# Patient Record
Sex: Male | Born: 1935 | Race: White | Hispanic: No | Marital: Single | Smoking: Never smoker
Health system: Southern US, Community
[De-identification: ages and names within clinical notes are randomized; demographics above are authoritative.]

## PROBLEM LIST (undated history)

## (undated) DIAGNOSIS — R32 Unspecified urinary incontinence: Secondary | ICD-10-CM

## (undated) DIAGNOSIS — K219 Gastro-esophageal reflux disease without esophagitis: Secondary | ICD-10-CM

## (undated) DIAGNOSIS — I10 Essential (primary) hypertension: Secondary | ICD-10-CM

## (undated) DIAGNOSIS — I2510 Atherosclerotic heart disease of native coronary artery without angina pectoris: Secondary | ICD-10-CM

## (undated) DIAGNOSIS — F0151 Vascular dementia with behavioral disturbance: Secondary | ICD-10-CM

## (undated) DIAGNOSIS — H04123 Dry eye syndrome of bilateral lacrimal glands: Secondary | ICD-10-CM

## (undated) DIAGNOSIS — R413 Other amnesia: Secondary | ICD-10-CM

## (undated) DIAGNOSIS — R6889 Other general symptoms and signs: Secondary | ICD-10-CM

## (undated) DIAGNOSIS — Z974 Presence of external hearing-aid: Secondary | ICD-10-CM

## (undated) DIAGNOSIS — Z955 Presence of coronary angioplasty implant and graft: Secondary | ICD-10-CM

## (undated) DIAGNOSIS — D638 Anemia in other chronic diseases classified elsewhere: Secondary | ICD-10-CM

## (undated) DIAGNOSIS — F01A18 Vascular dementia, mild, with other behavioral disturbance: Secondary | ICD-10-CM

## (undated) DIAGNOSIS — E559 Vitamin D deficiency, unspecified: Secondary | ICD-10-CM

## (undated) DIAGNOSIS — N489 Disorder of penis, unspecified: Secondary | ICD-10-CM

## (undated) DIAGNOSIS — N471 Phimosis: Secondary | ICD-10-CM

## (undated) HISTORY — PX: CORONARY ANGIOPLASTY WITH STENT PLACEMENT: SHX49

---

## 2015-07-28 ENCOUNTER — Other Ambulatory Visit: Payer: Self-pay | Admitting: Urology

## 2015-08-13 ENCOUNTER — Encounter (HOSPITAL_BASED_OUTPATIENT_CLINIC_OR_DEPARTMENT_OTHER): Payer: Self-pay | Admitting: *Deleted

## 2015-08-17 ENCOUNTER — Encounter (HOSPITAL_BASED_OUTPATIENT_CLINIC_OR_DEPARTMENT_OTHER): Payer: Self-pay | Admitting: *Deleted

## 2015-08-17 NOTE — Progress Notes (Addendum)
CALLED AND SPOKE W/ BRITTANY, LPN (PT'S NURSE) AT [facility] WHERE PT RESIDES , [facility]. BRITTANY WILL FAX PT'S LAST H&P AND MEDICATION LIST.  STATED PT IS INDEPENDENT WALKING, DRESSING, EATING , AND STATE HIS NEEDS.  PT SISTER, EDITH PATTERSON, WILL BE TRANSPORTING PT DOS. SISTER IS ALSO PT'S HCPOA.  WILL CALL BRITTANY BACK TO CONFIRM MEDS AND GIVE INSTRUCTIONS ABOUT THEM FOR DOS AND WILL FAX INSTRUCTIONS.  LEFT PT SISTER A MESSAGE ON HER PHONE TO CALL BACK.  SPOKE W/ PT'S SISTER, EDITH PATTERSON, (WHOM IS PT'S HCPOA AND GUARDIAN).  EDITH WILL BE TRANSPORTING PT DOS AND WILL BRING DOCUMENTS TO COPY FOR CHART (HCPOA, GUARDIANSHIP, LIVING WILL).  WILL ARRIVE AT 1115.   SPOKE W/ BRITTANY, LPN , (PT'S NURSE) AT [facility].  GIVEN INSTRUCTIONS THAT PT IS TO ARRIVE AT HERE 1115.  NPO AFTER MN WITH EXCEPTION SIPS OF WATER W/ METOPROLO, ZANTAC, DEPAKOTE, AND RISPERDAL AM DOS.  QUESTION ASKED ABOUT DISCHARGE/ HOME INSTRUCTIONS AND TOLD HER THAT PT/ SISTER WILL BRING WRITTEN INSTRUCTIONS BACK WITH THEM. ALSO , WILL FAX INSTRUCTIONS TO HER TODAY.  PT NEEDS ISTAT AND EKG.

## 2015-08-19 ENCOUNTER — Encounter (HOSPITAL_BASED_OUTPATIENT_CLINIC_OR_DEPARTMENT_OTHER): Payer: Self-pay | Admitting: *Deleted

## 2015-08-22 ENCOUNTER — Encounter (HOSPITAL_BASED_OUTPATIENT_CLINIC_OR_DEPARTMENT_OTHER): Payer: Self-pay | Admitting: Anesthesiology

## 2015-08-22 NOTE — Anesthesia Preprocedure Evaluation (Addendum)
Anesthesia Evaluation  Patient identified by MRN, date of birth, ID band Patient awake    Reviewed: Allergy & Precautions, NPO status , Patient's Chart, lab work & pertinent test results, reviewed documented beta blocker date and time   Airway Mallampati: III  TM Distance: >3 FB Neck ROM: Full    Dental   Pulmonary neg pulmonary ROS,    Pulmonary exam normal breath sounds clear to auscultation       Cardiovascular hypertension, Pt. on medications and Pt. on home beta blockers + CAD and + Cardiac Stents  Normal cardiovascular exam Rhythm:Regular Rate:Normal     Neuro/Psych PSYCHIATRIC DISORDERS Neurocognitive disorderHearing loss both ears Cerebrovascular disease    GI/Hepatic Neg liver ROS, GERD  Medicated and Controlled,  Endo/Other  negative endocrine ROS  Renal/GU negative Renal ROS Bladder dysfunction  Urinary incontinence Phimosis Penile mass negative genitourinary   Musculoskeletal  (+) Arthritis , Osteoarthritis,    Abdominal   Peds  Hematology  (+) anemia ,   Anesthesia Other Findings   Reproductive/Obstetrics                          Anesthesia Physical Anesthesia Plan  ASA: III  Anesthesia Plan: MAC   Post-op Pain Management:    Induction:   Airway Management Planned: Natural Airway and Simple Face Mask  Additional Equipment:   Intra-op Plan:   Post-operative Plan:   Informed Consent: I have reviewed the patients History and Physical, chart, labs and discussed the procedure including the risks, benefits and alternatives for the proposed anesthesia with the patient or authorized representative who has indicated his/her understanding and acceptance.   Dental advisory given  Plan Discussed with: CRNA, Anesthesiologist and Surgeon  Anesthesia Plan Comments:        Anesthesia Quick Evaluation

## 2015-08-23 ENCOUNTER — Other Ambulatory Visit: Payer: Self-pay

## 2015-08-23 ENCOUNTER — Encounter (HOSPITAL_BASED_OUTPATIENT_CLINIC_OR_DEPARTMENT_OTHER)
Admission: RE | Disposition: A | Discharge status: Home / Self Care | Payer: Self-pay | Source: Ambulatory Visit | Attending: Urology

## 2015-08-23 ENCOUNTER — Ambulatory Visit (HOSPITAL_BASED_OUTPATIENT_CLINIC_OR_DEPARTMENT_OTHER): Payer: Medicare PPO | Admitting: Anesthesiology

## 2015-08-23 ENCOUNTER — Ambulatory Visit (HOSPITAL_BASED_OUTPATIENT_CLINIC_OR_DEPARTMENT_OTHER)
Admission: RE | Admit: 2015-08-23 | Discharge: 2015-08-23 | Disposition: A | Discharge status: Home / Self Care | Payer: Medicare PPO | Source: Ambulatory Visit | Attending: Urology | Admitting: Urology

## 2015-08-23 ENCOUNTER — Encounter (HOSPITAL_BASED_OUTPATIENT_CLINIC_OR_DEPARTMENT_OTHER): Payer: Self-pay | Admitting: *Deleted

## 2015-08-23 DIAGNOSIS — I2510 Atherosclerotic heart disease of native coronary artery without angina pectoris: Secondary | ICD-10-CM | POA: Diagnosis not present

## 2015-08-23 DIAGNOSIS — Z79899 Other long term (current) drug therapy: Secondary | ICD-10-CM | POA: Diagnosis not present

## 2015-08-23 DIAGNOSIS — D649 Anemia, unspecified: Secondary | ICD-10-CM | POA: Diagnosis not present

## 2015-08-23 DIAGNOSIS — K219 Gastro-esophageal reflux disease without esophagitis: Secondary | ICD-10-CM | POA: Diagnosis not present

## 2015-08-23 DIAGNOSIS — I10 Essential (primary) hypertension: Secondary | ICD-10-CM | POA: Diagnosis not present

## 2015-08-23 DIAGNOSIS — Z955 Presence of coronary angioplasty implant and graft: Secondary | ICD-10-CM | POA: Diagnosis not present

## 2015-08-23 DIAGNOSIS — N480 Leukoplakia of penis: Secondary | ICD-10-CM | POA: Diagnosis not present

## 2015-08-23 DIAGNOSIS — Z7982 Long term (current) use of aspirin: Secondary | ICD-10-CM | POA: Diagnosis not present

## 2015-08-23 DIAGNOSIS — N471 Phimosis: Secondary | ICD-10-CM | POA: Diagnosis not present

## 2015-08-23 HISTORY — PX: CIRCUMCISION: SHX1350

## 2015-08-23 HISTORY — DX: Atherosclerotic heart disease of native coronary artery without angina pectoris: I25.10

## 2015-08-23 HISTORY — DX: Vascular dementia, mild, with other behavioral disturbance: F01.A18

## 2015-08-23 HISTORY — DX: Unspecified urinary incontinence: R32

## 2015-08-23 HISTORY — DX: Dry eye syndrome of bilateral lacrimal glands: H04.123

## 2015-08-23 HISTORY — DX: Disorder of penis, unspecified: N48.9

## 2015-08-23 HISTORY — DX: Vitamin D deficiency, unspecified: E55.9

## 2015-08-23 HISTORY — DX: Anemia in other chronic diseases classified elsewhere: D63.8

## 2015-08-23 HISTORY — DX: Vascular dementia with behavioral disturbance: F01.51

## 2015-08-23 HISTORY — DX: Essential (primary) hypertension: I10

## 2015-08-23 HISTORY — DX: Presence of coronary angioplasty implant and graft: Z95.5

## 2015-08-23 HISTORY — DX: Gastro-esophageal reflux disease without esophagitis: K21.9

## 2015-08-23 HISTORY — DX: Phimosis: N47.1

## 2015-08-23 HISTORY — DX: Presence of external hearing-aid: Z97.4

## 2015-08-23 HISTORY — DX: Other amnesia: R41.3

## 2015-08-23 HISTORY — DX: Other general symptoms and signs: R68.89

## 2015-08-23 LAB — POCT I-STAT, CHEM 8
BUN: 24 mg/dL — AB (ref 6–20)
CALCIUM ION: 1.22 mmol/L (ref 1.12–1.23)
Chloride: 105 mmol/L (ref 101–111)
Creatinine, Ser: 0.9 mg/dL (ref 0.61–1.24)
Glucose, Bld: 98 mg/dL (ref 65–99)
HEMATOCRIT: 37 % — AB (ref 39.0–52.0)
HEMOGLOBIN: 12.6 g/dL — AB (ref 13.0–17.0)
Potassium: 4.5 mmol/L (ref 3.5–5.1)
SODIUM: 142 mmol/L (ref 135–145)
TCO2: 26 mmol/L (ref 0–100)

## 2015-08-23 SURGERY — CIRCUMCISION, ADULT
Anesthesia: Monitor Anesthesia Care | Site: Penis

## 2015-08-23 MED ORDER — LIDOCAINE HCL (CARDIAC) 20 MG/ML IV SOLN
INTRAVENOUS | Status: DC | PRN
  Administered 2015-08-23: 50 mg via INTRAVENOUS

## 2015-08-23 MED ORDER — BUPIVACAINE HCL 0.25 % IJ SOLN
INTRAMUSCULAR | Status: DC | PRN
  Administered 2015-08-23: 9 mL

## 2015-08-23 MED ORDER — ONDANSETRON HCL 4 MG/2ML IJ SOLN
INTRAMUSCULAR | Status: DC | PRN
  Administered 2015-08-23: 4 mg via INTRAVENOUS

## 2015-08-23 MED ORDER — SULFAMETHOXAZOLE-TRIMETHOPRIM 400-80 MG PO TABS: 1.0000 | ORAL_TABLET | Freq: Two times a day (BID) | ORAL | Status: AC

## 2015-08-23 MED ORDER — FENTANYL CITRATE (PF) 100 MCG/2ML IJ SOLN
INTRAMUSCULAR | Status: DC | PRN
  Administered 2015-08-23: 25 ug via INTRAVENOUS

## 2015-08-23 MED ORDER — CEFAZOLIN SODIUM-DEXTROSE 2-4 GM/100ML-% IV SOLN
2.0000 g | INTRAVENOUS | Status: AC
  Administered 2015-08-23: 2 g via INTRAVENOUS
  Filled 2015-08-23: qty 100

## 2015-08-23 MED ORDER — CEFAZOLIN IN D5W 1 GM/50ML IV SOLN
1.0000 g | INTRAVENOUS | Status: DC
  Filled 2015-08-23: qty 50

## 2015-08-23 MED ORDER — LACTATED RINGERS IV SOLN
INTRAVENOUS | Status: DC
  Administered 2015-08-23: 12:00:00 via INTRAVENOUS
  Filled 2015-08-23: qty 1000

## 2015-08-23 MED ORDER — PROPOFOL 500 MG/50ML IV EMUL
INTRAVENOUS | Status: DC | PRN
  Administered 2015-08-23: 150 ug/kg/min via INTRAVENOUS

## 2015-08-23 MED ORDER — DEXAMETHASONE SODIUM PHOSPHATE 4 MG/ML IJ SOLN
INTRAMUSCULAR | Status: DC | PRN
  Administered 2015-08-23: 10 mg via INTRAVENOUS

## 2015-08-23 MED ORDER — LIDOCAINE HCL (PF) 1 % IJ SOLN
INTRAMUSCULAR | Status: DC | PRN
  Administered 2015-08-23: 9 mL

## 2015-08-23 MED ORDER — FENTANYL CITRATE (PF) 100 MCG/2ML IJ SOLN
25.0000 ug | INTRAMUSCULAR | Status: DC | PRN
  Filled 2015-08-23: qty 1

## 2015-08-23 MED ORDER — TRAMADOL HCL 50 MG PO TABS: 50.0000 mg | ORAL_TABLET | Freq: Four times a day (QID) | ORAL | Status: AC | PRN

## 2015-08-23 SURGICAL SUPPLY — 32 items
BANDAGE COBAN STERILE 2 (GAUZE/BANDAGES/DRESSINGS) ×3 IMPLANT
BLADE CLIPPER SENSICLIP SURGIC (BLADE) ×3 IMPLANT
BLADE SURG 15 STRL LF DISP TIS (BLADE) ×1 IMPLANT
BLADE SURG 15 STRL SS (BLADE) ×2
BNDG CONFORM 2 STRL LF (GAUZE/BANDAGES/DRESSINGS) ×3 IMPLANT
COVER BACK TABLE 60X90IN (DRAPES) ×3 IMPLANT
COVER MAYO STAND STRL (DRAPES) ×3 IMPLANT
DECANTER SPIKE VIAL GLASS SM (MISCELLANEOUS) IMPLANT
DRAPE LAPAROTOMY 100X72 PEDS (DRAPES) ×3 IMPLANT
ELECT NEEDLE BLADE 2-5/6 (NEEDLE) ×3 IMPLANT
ELECT NEEDLE TIP 2.8 STRL (NEEDLE) ×3 IMPLANT
ELECT REM PT RETURN 9FT ADLT (ELECTROSURGICAL) ×3
ELECTRODE REM PT RTRN 9FT ADLT (ELECTROSURGICAL) ×1 IMPLANT
GAUZE SPONGE 4X4 16PLY XRAY LF (GAUZE/BANDAGES/DRESSINGS) ×3 IMPLANT
GAUZE XEROFORM 1X8 LF (GAUZE/BANDAGES/DRESSINGS) ×3 IMPLANT
GLOVE BIO SURGEON STRL SZ8 (GLOVE) ×3 IMPLANT
GLOVE BIOGEL PI IND STRL 7.5 (GLOVE) ×3 IMPLANT
GLOVE BIOGEL PI INDICATOR 7.5 (GLOVE) ×6
GOWN STRL REUS W/ TWL XL LVL3 (GOWN DISPOSABLE) ×1 IMPLANT
GOWN STRL REUS W/TWL XL LVL3 (GOWN DISPOSABLE) ×2
KIT ROOM TURNOVER WOR (KITS) ×3 IMPLANT
NEEDLE HYPO 25X1 1.5 SAFETY (NEEDLE) ×3 IMPLANT
NS IRRIG 500ML POUR BTL (IV SOLUTION) ×3 IMPLANT
PACK BASIN DAY SURGERY FS (CUSTOM PROCEDURE TRAY) ×3 IMPLANT
PENCIL BUTTON HOLSTER BLD 10FT (ELECTRODE) ×3 IMPLANT
SUT VICRYL 4-0 PS2 18IN ABS (SUTURE) ×6 IMPLANT
SYR CONTROL 10ML LL (SYRINGE) ×3 IMPLANT
TOWEL OR 17X26 10 PK STRL BLUE (TOWEL DISPOSABLE) ×3 IMPLANT
TRAY DSU PREP LF (CUSTOM PROCEDURE TRAY) ×3 IMPLANT
TUBE CONNECTING 12'X1/4 (SUCTIONS)
TUBE CONNECTING 12X1/4 (SUCTIONS) IMPLANT
WATER STERILE IRR 500ML POUR (IV SOLUTION) IMPLANT

## 2015-08-23 NOTE — Anesthesia Procedure Notes (Signed)
Procedure Name: MAC Date/Time: 08/23/2015 12:30 PM Performed by: Tyrone NineSAUVE, Mardy Hoppe F Pre-anesthesia Checklist: Patient identified, Timeout performed, Emergency Drugs available, Patient being monitored and Suction available Patient Re-evaluated:Patient Re-evaluated prior to inductionOxygen Delivery Method: Nasal cannula Intubation Type: IV induction Placement Confirmation: positive ETCO2 and breath sounds checked- equal and bilateral Dental Injury: Teeth and Oropharynx as per pre-operative assessment

## 2015-08-23 NOTE — Discharge Instructions (Signed)
Circumcision, Adult, Care After These instructions give you information on caring for yourself after your procedure. Your doctor may also give you more specific instructions. Call your doctor if you have any problems or questions after your procedure. HOME CARE  Only take medicine as told by your doctor.  Any bandages (dressings) should stay on for at least 24 hours.  You may take the bandages off at night to let air get to the area where your doctor made a cut (incision site). Once a scab forms over the cut, you will not need to use bandages.  Carefully remove the bandage if it gets dirty. Apply medicated cream to the cut. Carefully put a new bandage on if a scab has not formed.  Do not have sex until your doctor says it is okay.  Do not get your cut wet for 24 hours or as told by your doctor.  You may take a sponge bath. Clean around the cut gently with mild soap and water.  You may take a shower after 24 hours or as told by your doctor. Do not take a tub bath. After you shower, gently pat your cut dry. Do not rub it.  Avoid heavy lifting.  Avoid contact sports, biking, or swimming until you have healed. This usually takes 10-14 days. GET HELP IF:  You have pain that does not go away after you take medicine for it.  You have puffiness (swelling) or redness that is unexpected.  You have a fever. GET HELP RIGHT AWAY IF:   You cannot pee (urinate).  You have pain when you pee.  Your pain is not helped by medicines.  There is redness, puffiness, and soreness spreading up the shaft of your penis, your thighs, or your lower belly (abdomen).  There is yellowish-white fluid (pus) coming from your cut.  You have bleeding that does not stop when you press on it. MAKE SURE YOU:  Understand these instructions.  Will watch your condition.  Will get help right away if you are not doing well or get worse.   This information is not intended to replace advice given to you by your  health care provider. Make sure you discuss any questions you have with your health care provider.   Document Released: 07/12/2007 Document Revised: 02/13/2014 Document Reviewed: 09/19/2012 Elsevier Interactive Patient Education 2016 [employer]  Post Anesthesia Home Care Instructions  Activity: Get plenty of rest for the remainder of the day. A responsible adult should stay with you for 24 hours following the procedure.  For the next 24 hours, DO NOT: -Drive a car -Advertising copywriterperate machinery -Drink alcoholic beverages -Take any medication unless instructed by your physician -Make any legal decisions or sign important papers.  Meals: Start with liquid foods such as gelatin or soup. Progress to regular foods as tolerated. Avoid greasy, spicy, heavy foods. If nausea and/or vomiting occur, drink only clear liquids until the nausea and/or vomiting subsides. Call your physician if vomiting continues.  Special Instructions/Symptoms: Your throat may feel dry or sore from the anesthesia or the breathing tube placed in your throat during surgery. If this causes discomfort, gargle with warm salt water. The discomfort should disappear within 24 hours.  If you had a scopolamine patch placed behind your ear for the management of post- operative nausea and/or vomiting:  1. The medication in the patch is effective for 72 hours, after which it should be removed.  Wrap patch in a tissue and discard in the trash. Wash hands  thoroughly with soap and water. 2. You may remove the patch earlier than 72 hours if you experience unpleasant side effects which may include dry mouth, dizziness or visual disturbances. 3. Avoid touching the patch. Wash your hands with soap and water after contact with the patch.

## 2015-08-23 NOTE — H&P (Signed)
Urology Admission H&P  Chief Complaint: penile lesion  History of Present Illness: Willie Reyes is an 82yo who developed phimosis and an ulcerated penile lesions. He has intermittent bleeding from the lesion. He has severe LUTS and a feeling of incomplete emptying. He denies any penile pain.  Past Medical History  Diagnosis Date  . Penile lesion   . Phimosis   . Hypertension   . Wears hearing aid     BILATERAL  . Major neurocognitive disorder, due to vascular disease, with behavioral disturbance, mild     RESIDES AT [facility] -  [facility]  . GERD (gastroesophageal reflux disease)   . Urinary incontinence   . Chronic dryness of both eyes   . Chronic disease anemia   . Cold intolerance   . Vitamin D deficiency   . Coronary artery disease   . S/P coronary artery stent placement   . Impaired memory    Past Surgical History  Procedure Laterality Date  . Coronary angioplasty with stent placement  1990's   in [place]    Home Medications:  Prescriptions prior to admission  Medication Sig Dispense Refill Last Dose  . acetaminophen (TYLENOL) 325 MG tablet Take 325 mg by mouth every 4 (four) hours as needed for mild pain or fever.   08/22/2015 at Unknown time  . aspirin EC 81 MG tablet Take 81 mg by mouth every morning.   08/22/2015 at Unknown time  . Carboxymethylcellulose Sodium (REFRESH CELLUVISC OP) Place 1 drop into both eyes every 3 (three) hours.   08/22/2015 at Unknown time  . Cholecalciferol (VITAMIN D3) 2000 units TABS Take 1 capsule by mouth every morning.   08/22/2015 at Unknown time  . cycloSPORINE (RESTASIS) 0.05 % ophthalmic emulsion Place 1 drop into both eyes 2 (two) times daily.   08/22/2015 at Unknown time  . divalproex (DEPAKOTE) 125 MG DR tablet Take 125 mg by mouth every morning.   08/23/2015 at 0800  . docusate sodium (COLACE) 100 MG capsule Take 100 mg by mouth every morning.   08/22/2015 at Unknown time  . ferrous sulfate 325 (65 FE)  MG EC tablet Take 325 mg by mouth 2 (two) times daily.   08/22/2015 at Unknown time  . guaiFENesin (MUCINEX) 600 MG 12 hr tablet Take by mouth 2 (two) times daily as needed.   08/22/2015 at Unknown time  . lactose free nutrition (BOOST) LIQD Take 237 mLs by mouth every evening. At 1700   08/22/2015 at Unknown time  . lisinopril (PRINIVIL,ZESTRIL) 10 MG tablet Take 10 mg by mouth every morning.   08/22/2015 at Unknown time  . loperamide (IMODIUM) 2 MG capsule Take 2 mg by mouth every 4 (four) hours as needed for diarrhea or loose stools.   08/22/2015 at Unknown time  . metoprolol succinate (TOPROL-XL) 50 MG 24 hr tablet Take 50 mg by mouth every morning. Take with or immediately following a meal.   08/23/2015 at 0800  . Multiple Vitamin (MULTIVITAMIN) tablet Take 1 tablet by mouth every morning.   08/22/2015 at Unknown time  . nystatin (MYCOSTATIN/NYSTOP) powder Apply topically 2 (two) times daily. Apply to buttocks   08/22/2015 at Unknown time  . nystatin cream (MYCOSTATIN) Apply 1 application topically 2 (two) times daily as needed for dry skin (buttocks).   08/22/2015 at Unknown time  . Polyethyl Glycol-Propyl Glycol (SYSTANE) 0.4-0.3 % SOLN Apply to eye every 4 (four) hours as needed (dry eyes).   08/22/2015 at Unknown  time  . polyethylene glycol (MIRALAX / GLYCOLAX) packet Take 17 g by mouth every morning.   08/22/2015 at Unknown time  . Propylene Glycol-Glycerin (ARTIFICIAL TEARS) 1-0.3 % SOLN Place into both eyes at bedtime.   08/22/2015 at Unknown time  . ranitidine (ZANTAC) 150 MG tablet Take 150 mg by mouth 2 (two) times daily.   08/23/2015 at 0800  . risperiDONE (RISPERDAL) 0.5 MG tablet Take 0.5 mg by mouth 2 (two) times daily.   08/23/2015 at 0800  . senna (SENOKOT) 8.6 MG tablet Take 2 tablets by mouth daily as needed for constipation.   08/22/2015 at Unknown time  . simvastatin (ZOCOR) 20 MG tablet Take 20 mg by mouth at bedtime.   08/22/2015 at Unknown time  . sodium fluoride (DENTA 5000 PLUS) 1.1 %  CREA dental cream Place 1 application onto teeth 2 (two) times daily.   08/22/2015 at Unknown time  . traZODone (DESYREL) 50 MG tablet Take 25 mg by mouth at bedtime.   08/22/2015 at Unknown time  . glycopyrrolate (ROBINUL) 1 MG tablet Take 1 mg by mouth daily as needed.   Unknown at Unknown time  . nitroGLYCERIN (NITROSTAT) 0.4 MG SL tablet Place 0.4 mg under the tongue every 5 (five) minutes as needed for chest pain.   Unknown at Unknown time   Allergies: No Known Allergies  History reviewed. No pertinent family history. Social History:  reports that he has never smoked. He has never used smokeless tobacco. He reports that he does not drink alcohol or use illicit drugs.  Review of Systems  All other systems reviewed and are negative.   Physical Exam:  Vital signs in last 24 hours: Temp:  [97.4 F (36.3 C)] 97.4 F (36.3 C) (07/17 1107) Pulse Rate:  [73] 73 (07/17 1107) Resp:  [16] 16 (07/17 1107) BP: (130)/(60) 130/60 mmHg (07/17 1107) SpO2:  [100 %] 100 % (07/17 1107) Weight:  [72.122 kg (159 lb)] 72.122 kg (159 lb) (07/17 1107) Physical Exam  Constitutional: He is oriented to person, place, and time. He appears well-developed and well-nourished.  HENT:  Head: Normocephalic and atraumatic.  Eyes: EOM are normal. Pupils are equal, round, and reactive to light.  Neck: Normal range of motion. No thyromegaly present.  Cardiovascular: Normal rate and regular rhythm.   Respiratory: Effort normal. No respiratory distress.  GI: Soft. He exhibits no distension.  Musculoskeletal: Normal range of motion.  Neurological: He is alert and oriented to person, place, and time.  Skin: Skin is warm and dry.  Psychiatric: He has a normal mood and affect. His behavior is normal. Judgment and thought content normal.    Laboratory Data:  Results for orders placed or performed during the hospital encounter of 08/23/15 (from the past 24 hour(s))  I-STAT, chem 8     Status: Abnormal   Collection  Time: 08/23/15 12:04 PM  Result Value Ref Range   Sodium 142 135 - 145 mmol/L   Potassium 4.5 3.5 - 5.1 mmol/L   Chloride 105 101 - 111 mmol/L   BUN 24 (H) 6 - 20 mg/dL   Creatinine, Ser 1.61 0.61 - 1.24 mg/dL   Glucose, Bld 98 65 - 99 mg/dL   Calcium, Ion 0.96 0.45 - 1.23 mmol/L   TCO2 26 0 - 100 mmol/L   Hemoglobin 12.6 (L) 13.0 - 17.0 g/dL   HCT 40.9 (L) 81.1 - 91.4 %   No results found for this or any previous visit (from the past 240 hour(s)). Creatinine:  Recent Labs  08/23/15 1204  CREATININE 0.90   Baseline Creatinine: 0.9  Impression/Assessment:  82yo with phimosis and penile lesions  Plan:  The risks/benefits/alternatives to excision of penile lesion and circumcision was explaiend to the patient and he understands and wishes to proceed with surgery  Willie Reyes 08/23/2015, 12:33 PM

## 2015-08-23 NOTE — Anesthesia Postprocedure Evaluation (Signed)
Anesthesia Post Note  Patient: Willie Reyes  Procedure(s) Performed: Procedure(s) (LRB): CIRCUMCISION ADULT (N/A)  Patient location during evaluation: PACU Anesthesia Type: MAC Level of consciousness: awake and alert Pain management: pain level controlled Vital Signs Assessment: post-procedure vital signs reviewed and stable Respiratory status: spontaneous breathing, nonlabored ventilation, respiratory function stable and patient connected to nasal cannula oxygen Cardiovascular status: stable and blood pressure returned to baseline Anesthetic complications: no    Last Vitals:  Filed Vitals:   08/23/15 1345 08/23/15 1400  BP: 113/43 118/47  Pulse: 60 62  Temp:    Resp: 6 9    Last Pain:  Filed Vitals:   08/23/15 1402  PainSc: 0-No pain                 Johnothan Bascomb DAVID

## 2015-08-23 NOTE — Transfer of Care (Signed)
Immediate Anesthesia Transfer of Care Note  Patient: Willie Reyes  Procedure(s) Performed: Procedure(s): CIRCUMCISION ADULT (N/A)  Patient Location: PACU  Anesthesia Type:MAC  Level of Consciousness: awake, alert , oriented and patient cooperative  Airway & Oxygen Therapy: Patient Spontanous Breathing and Patient connected to nasal cannula oxygen  Post-op Assessment: Report given to RN and Post -op Vital signs reviewed and stable  Post vital signs: Reviewed and stable  Last Vitals:  Filed Vitals:   08/23/15 1107  BP: 130/60  Pulse: 73  Temp: 36.3 C  Resp: 16    Last Pain: There were no vitals filed for this visit.    Patients Stated Pain Goal: 5 (08/23/15 1140)  Complications: No apparent anesthesia complications

## 2015-08-24 ENCOUNTER — Encounter (HOSPITAL_BASED_OUTPATIENT_CLINIC_OR_DEPARTMENT_OTHER): Payer: Self-pay | Admitting: Urology

## 2015-08-24 NOTE — Op Note (Signed)
Preoperative diagnosis: phimosis, penile lesion  Postoperative diagnosis: Same  Procedure: circumcision  Attending: Wilkie AyePatrick Dorethea Strubel, MD  Anesthesia: MAC with local  History of blood loss: Minimal  Antibiotics: ancef  Drains: none  Specimens: foreskin  Findings: dense phimotic ring. 2cm lesion on ventral foreskin.  Indications: Patient is a 82 year old male with a history of phimosis, penile lesion, difficulty urinating, and recurrent balanoprothitis  After discussing treatment options he decided to proceed with circumcision   Procedure in detail: Prior to procedure consent was obtained.  Patient was brought to the operating room and a brief timeout was done to ensure correct patient, correct procedure, correct site.  Local anesthesia and MAC was administered and patient was placed in supine position.  His genitalia and abdomen was then prepped and draped in usual sterile fashion.  The phimotic ring was incised sharply and the foreskin was then retracted. An circumferential incision was made 1.5cm below the coronal ridge. We then pulled the foreskin over the glans and made a separate circumferential incision at the level of the coronal ridge. We then sharply incised the foreskin and then freed it from the dartos using electrocautery. We then sent the foreskin for pathology. Individual bleeders were then cauterized and good hemostasis was noted. We then reapproximated the penile skin to the subcoronal incision. We used interrupted 3-0 vicryl to close the incision. Once this was complete we applied a gauze bandage and this then concluded the procedure which was well tolerated by the patient.  Complications: None  Condition: Stable, extubated, transferred to PACU.  Plan: Patient is to be discharged home.  He is to followup in 2 weeks for a wound check

## 2016-02-16 ENCOUNTER — Emergency Department (HOSPITAL_COMMUNITY): Payer: Medicare PPO

## 2016-02-16 ENCOUNTER — Emergency Department (HOSPITAL_COMMUNITY)
Admission: EM | Admit: 2016-02-16 | Discharge: 2016-03-09 | Disposition: E | Payer: Medicare PPO | Attending: Emergency Medicine | Admitting: Emergency Medicine

## 2016-02-16 ENCOUNTER — Encounter (HOSPITAL_COMMUNITY): Payer: Self-pay

## 2016-02-16 DIAGNOSIS — Z955 Presence of coronary angioplasty implant and graft: Secondary | ICD-10-CM | POA: Insufficient documentation

## 2016-02-16 DIAGNOSIS — R52 Pain, unspecified: Secondary | ICD-10-CM

## 2016-02-16 DIAGNOSIS — R945 Abnormal results of liver function studies: Secondary | ICD-10-CM

## 2016-02-16 DIAGNOSIS — I959 Hypotension, unspecified: Secondary | ICD-10-CM | POA: Insufficient documentation

## 2016-02-16 DIAGNOSIS — I2510 Atherosclerotic heart disease of native coronary artery without angina pectoris: Secondary | ICD-10-CM | POA: Diagnosis not present

## 2016-02-16 DIAGNOSIS — R1013 Epigastric pain: Secondary | ICD-10-CM | POA: Insufficient documentation

## 2016-02-16 DIAGNOSIS — Z7982 Long term (current) use of aspirin: Secondary | ICD-10-CM | POA: Insufficient documentation

## 2016-02-16 DIAGNOSIS — N179 Acute kidney failure, unspecified: Secondary | ICD-10-CM | POA: Diagnosis not present

## 2016-02-16 DIAGNOSIS — I10 Essential (primary) hypertension: Secondary | ICD-10-CM | POA: Insufficient documentation

## 2016-02-16 DIAGNOSIS — R7989 Other specified abnormal findings of blood chemistry: Secondary | ICD-10-CM

## 2016-02-16 DIAGNOSIS — R531 Weakness: Secondary | ICD-10-CM | POA: Diagnosis present

## 2016-02-16 LAB — TYPE AND SCREEN
ABO/RH(D): O POS
Antibody Screen: NEGATIVE

## 2016-02-16 LAB — COMPREHENSIVE METABOLIC PANEL
ALT: 1617 U/L — ABNORMAL HIGH (ref 17–63)
AST: 1619 U/L — ABNORMAL HIGH (ref 15–41)
Albumin: 2.3 g/dL — ABNORMAL LOW (ref 3.5–5.0)
Alkaline Phosphatase: 55 U/L (ref 38–126)
Anion gap: 13 (ref 5–15)
BUN: 34 mg/dL — ABNORMAL HIGH (ref 6–20)
CO2: 19 mmol/L — ABNORMAL LOW (ref 22–32)
Calcium: 8 mg/dL — ABNORMAL LOW (ref 8.9–10.3)
Chloride: 106 mmol/L (ref 101–111)
Creatinine, Ser: 2.44 mg/dL — ABNORMAL HIGH (ref 0.61–1.24)
GFR calc Af Amer: 25 mL/min — ABNORMAL LOW (ref >60)
GFR calc non Af Amer: 22 mL/min — ABNORMAL LOW (ref >60)
Glucose, Bld: 145 mg/dL — ABNORMAL HIGH (ref 65–99)
Potassium: 5.3 mmol/L — ABNORMAL HIGH (ref 3.5–5.1)
Sodium: 138 mmol/L (ref 135–145)
Total Bilirubin: 0.4 mg/dL (ref 0.3–1.2)
Total Protein: 6 g/dL — ABNORMAL LOW (ref 6.5–8.1)

## 2016-02-16 LAB — CBC
HCT: 35.6 % — ABNORMAL LOW (ref 39.0–52.0)
Hemoglobin: 12.2 g/dL — ABNORMAL LOW (ref 13.0–17.0)
MCH: 30.4 pg (ref 26.0–34.0)
MCHC: 34.3 g/dL (ref 30.0–36.0)
MCV: 88.8 fL (ref 78.0–100.0)
Platelets: 337 10*3/uL (ref 150–400)
RBC: 4.01 MIL/uL — ABNORMAL LOW (ref 4.22–5.81)
RDW: 13.3 % (ref 11.5–15.5)
WBC: 13.6 10*3/uL — ABNORMAL HIGH (ref 4.0–10.5)

## 2016-02-16 LAB — CBG MONITORING, ED: GLUCOSE-CAPILLARY: 106 mg/dL — AB (ref 65–99)

## 2016-02-16 LAB — LIPASE, BLOOD: Lipase: 52 U/L — ABNORMAL HIGH (ref 11–51)

## 2016-02-16 LAB — I-STAT CG4 LACTIC ACID, ED: Lactic Acid, Venous: 4.41 mmol/L (ref 0.5–1.9)

## 2016-02-16 LAB — POC OCCULT BLOOD, ED: Fecal Occult Bld: POSITIVE — AB

## 2016-02-16 LAB — ABO/RH: ABO/RH(D): O POS

## 2016-02-16 MED ORDER — SODIUM CHLORIDE 0.9 % IV BOLUS (SEPSIS)
2000.0000 mL | Freq: Once | INTRAVENOUS | Status: AC
  Administered 2016-02-16: 2000 mL via INTRAVENOUS

## 2016-02-16 MED ORDER — CALCIUM CHLORIDE 10 % IV SOLN
INTRAVENOUS | Status: AC | PRN
  Administered 2016-02-16: 1 g via INTRAVENOUS

## 2016-02-16 MED ORDER — ROCURONIUM BROMIDE 50 MG/5ML IV SOLN
INTRAVENOUS | Status: AC | PRN
  Administered 2016-02-16: 80 mg via INTRAVENOUS

## 2016-02-16 MED ORDER — SODIUM BICARBONATE 8.4 % IV SOLN
INTRAVENOUS | Status: AC | PRN
  Administered 2016-02-16 (×2): 50 meq via INTRAVENOUS

## 2016-02-16 MED ORDER — SODIUM CHLORIDE 0.9 % IV SOLN
80.0000 mg | Freq: Once | INTRAVENOUS | Status: AC
  Administered 2016-02-16: 80 mg via INTRAVENOUS
  Filled 2016-02-16: qty 80

## 2016-02-16 MED ORDER — EPINEPHRINE PF 1 MG/10ML IJ SOSY
PREFILLED_SYRINGE | INTRAMUSCULAR | Status: AC | PRN
  Administered 2016-02-16 (×6): 1 via INTRAVENOUS

## 2016-02-16 MED FILL — Medication: Qty: 1 | Status: AC

## 2016-02-17 LAB — HEPATITIS PANEL, ACUTE
HCV Ab: <0.1 s/co ratio (ref 0.0–0.9)
Hep A IgM: NEGATIVE
Hep B C IgM: NEGATIVE
Hepatitis B Surface Ag: NEGATIVE

## 2016-02-21 LAB — CULTURE, BLOOD (ROUTINE X 2): Culture: NO GROWTH

## 2016-03-09 NOTE — ED Notes (Signed)
Pt returned from ultrasound following period of vomiting and SOB. Ultrasound technician called this RN regarding pt declining status. Secretary notified to [facility] team. This RN and EMT Danielle at bedside, Dr. Juleen ChinaKohut notified and at bedside. Pt placed on 2L [place], O2 sats 95% on RA. Pt remains AxO to baseline. Pt diaphoretic with emesis on gown on assessment. Pt changed and bed linens cleaned, noted to have black tarry bowel movement during cleaning.

## 2016-03-09 NOTE — ED Notes (Signed)
Pt aware of need for urine currently trying

## 2016-03-09 NOTE — Code Documentation (Addendum)
PEA noted at 1321, no femoral or Carotid Pulse palpable. PEA on monitor. Compressions resumed immediately.

## 2016-03-09 NOTE — Code Documentation (Addendum)
Patient time of death occurred at [id number]. Family updated, Chaplain at bedside with family.

## 2016-03-09 NOTE — Code Documentation (Signed)
Organ procurement team notified.

## 2016-03-09 NOTE — Code Documentation (Signed)
Pulse check with doppler finds faint femoral pulse and wide ventricular rhythm showing on monitor ranging from 70-110bpm.

## 2016-03-09 NOTE — Code Documentation (Signed)
Family updated as to patient's status.

## 2016-03-09 NOTE — Code Documentation (Signed)
  Patient Name: Willie GellGeorge Reyes   MRN: [id number]   Date of Birth/ Sex: 07/19/1924 , male      Admission Date: 14-Oct-2016  Attending Provider: Raeford RazorStephen Kohut, MD  Primary Diagnosis: Generalized Weakness   Indication: Pt was in his usual state of health until this PM, when he was noted to be PEA. Code blue was subsequently called. At the time of arrival on scene, ACLS protocol was underway.   Technical Description:  - CPR performance duration:  23 minutes  - Was defibrillation or cardioversion used? No   - Was external pacer placed? No  - Was patient intubated pre/post CPR? Yes   Medications Administered: Y = Yes; Blank = No Amiodarone    Atropine    Calcium  y  Epinephrine  y  Lidocaine    Magnesium    Norepinephrine    Phenylephrine    Sodium bicarbonate  y  Vasopressin    Other y   Post CPR evaluation:  - Final Status - Was patient successfully resuscitated ? No   Miscellaneous Information:  - Time of death:  13:49 PM  - Primary team notified?  Yes -admitting team present at bedside  - Family Notified? Yes     Gwynn BurlyAndrew Tekelia Kareem, DO   14-Oct-2016, 3:18 PM

## 2016-03-09 NOTE — ED Provider Notes (Signed)
[facility]-EMERGENCY DEPT Provider Note    By signing my name below, I, Earmon Phoenix, attest that this documentation has been prepared under the direction and in the presence of Raeford Razor, MD. Electronically Signed: Earmon Phoenix, ED Scribe. 03-09-16. 1:33 PM.    History   Chief Complaint Chief Complaint  Patient presents with  . Weakness  . Diarrhea   The history is provided by the patient and medical records. No language interpreter was used.    LEVEL 5 CAVEAT- Full history could not be obtained due to dementia.   HPI Comments:  Josemaria Brining is an 83 y.o. male, with PMHx of anemia, CAD, GERD, HTN brought in by EMS from [facility], who presents to the Emergency Department complaining of diarrhea that began early this morning. Sister and pt report associated nausea, vomiting a few days ago, mild SOB and generalized weakness onset this morning and could not walk without assistance. She states the pt has been complaining of mid centralized CP but is unsure if it is his GERD or not. Sister states the nursing home he is in has had severeal patients with similar symptoms. He has not been given any treatment for his symptoms. There are no modifying factors noted. Sister is unsure of hematemesis. He denies current vomiting.    Past Medical History:  Diagnosis Date  . Chronic disease anemia   . Chronic dryness of both eyes   . Cold intolerance   . Coronary artery disease   . GERD (gastroesophageal reflux disease)   . Hypertension   . Impaired memory   . Major neurocognitive disorder, due to vascular disease, with behavioral disturbance, mild    RESIDES AT [facility]  . Penile lesion   . Phimosis   . S/P coronary artery stent placement   . Urinary incontinence   . Vitamin D deficiency   . Wears hearing aid    BILATERAL    There are no active problems to display for this patient.   Past Surgical  History:  Procedure Laterality Date  . CIRCUMCISION N/A 08/23/2015   Procedure: CIRCUMCISION ADULT;  Surgeon: Malen Gauze, MD;  Location: [facility];  Service: Urology;  Laterality: N/A;  . CORONARY ANGIOPLASTY WITH STENT PLACEMENT  1990's   in [place]       Home Medications    Prior to Admission medications   Medication Sig Start Date End Date Taking? Authorizing Provider  acetaminophen (TYLENOL) 325 MG tablet Take 325 mg by mouth every 4 (four) hours as needed for mild pain or fever.   Yes Historical Provider, MD  aspirin EC 81 MG tablet Take 81 mg by mouth every morning.   Yes Historical Provider, MD  Carboxymethylcellulose Sodium (REFRESH CELLUVISC OP) Place 1 drop into both eyes every 3 (three) hours.   Yes Historical Provider, MD  cholecalciferol (VITAMIN D) 400 units TABS tablet Take 800 Units by mouth daily.   Yes Historical Provider, MD  cycloSPORINE (RESTASIS) 0.05 % ophthalmic emulsion Place 1 drop into both eyes 2 (two) times daily.   Yes Historical Provider, MD  divalproex (DEPAKOTE) 125 MG DR tablet Take 125 mg by mouth every morning.   Yes Historical Provider, MD  docusate sodium (COLACE) 100 MG capsule Take 100 mg by mouth every morning.   Yes Historical Provider, MD  ferrous sulfate 325 (65 FE) MG EC tablet Take 325 mg by mouth 2 (two) times daily.  Yes Historical Provider, MD  glycopyrrolate (ROBINUL) 1 MG tablet Take 1 mg by mouth daily as needed. antisialogogue   Yes Historical Provider, MD  guaiFENesin (MUCINEX) 600 MG 12 hr tablet Take 600 mg by mouth 2 (two) times daily as needed.    Yes Historical Provider, MD  lactose free nutrition (BOOST) LIQD Take 237 mLs by mouth every evening. At 1700   Yes Historical Provider, MD  lisinopril (PRINIVIL,ZESTRIL) 10 MG tablet Take 10 mg by mouth every morning.   Yes Historical Provider, MD  loperamide (IMODIUM) 2 MG capsule Take 2 mg by mouth every 4 (four) hours as needed for diarrhea or loose stools.    Yes Historical Provider, MD  metoprolol succinate (TOPROL-XL) 50 MG 24 hr tablet Take 50 mg by mouth every morning. Take with or immediately following a meal.   Yes Historical Provider, MD  Multiple Vitamin (MULTIVITAMIN) tablet Take 1 tablet by mouth every morning.   Yes Historical Provider, MD  nitroGLYCERIN (NITROSTAT) 0.4 MG SL tablet Place 0.4 mg under the tongue every 5 (five) minutes as needed for chest pain.   Yes Historical Provider, MD  nystatin (MYCOSTATIN/NYSTOP) powder Apply 1 g topically 2 (two) times daily. Apply thin layer to buttocks twice daily and as needed   Yes Historical Provider, MD  nystatin cream (MYCOSTATIN) Apply 1 application topically 2 (two) times daily as needed for dry skin (buttocks).   Yes Historical Provider, MD  ondansetron (ZOFRAN) 4 MG tablet Take 4 mg by mouth every 8 (eight) hours as needed for nausea or vomiting.   Yes Historical Provider, MD  Polyethyl Glycol-Propyl Glycol (SYSTANE) 0.4-0.3 % SOLN Place 1 drop into both eyes 4 (four) times daily as needed (dry eyes).    Yes Historical Provider, MD  polyethylene glycol (MIRALAX / GLYCOLAX) packet Take 17 g by mouth every morning.   Yes Historical Provider, MD  ranitidine (ZANTAC) 150 MG tablet Take 150 mg by mouth 2 (two) times daily.   Yes Historical Provider, MD  risperiDONE (RISPERDAL) 0.25 MG tablet Take 0.25 mg by mouth at bedtime.   Yes Historical Provider, MD  senna (SENOKOT) 8.6 MG tablet Take 2 tablets by mouth daily as needed for constipation.   Yes Historical Provider, MD  simvastatin (ZOCOR) 20 MG tablet Take 20 mg by mouth at bedtime.   Yes Historical Provider, MD  sodium fluoride (DENTA 5000 PLUS) 1.1 % CREA dental cream Place 1 application onto teeth 2 (two) times daily.   Yes Historical Provider, MD  SODIUM FLUORIDE, DENTAL RINSE, (PREVIDENT) 0.2 % SOLN Use as directed 10 mLs in the mouth or throat once a week.   Yes Historical Provider, MD  traMADol (ULTRAM) 50 MG tablet Take 1 tablet (50 mg  total) by mouth every 6 (six) hours as needed. Patient taking differently: Take 50 mg by mouth every 6 (six) hours as needed for moderate pain.  08/23/15  Yes Malen GauzePatrick L McKenzie, MD  traZODone (DESYREL) 50 MG tablet Take 25 mg by mouth at bedtime.   Yes Historical Provider, MD  sulfamethoxazole-trimethoprim (BACTRIM) 400-80 MG tablet Take 1 tablet by mouth 2 (two) times daily. Patient not taking: Reported on 01-23-17 08/23/15   Malen GauzePatrick L McKenzie, MD    Family History History reviewed. No pertinent family history.  Social History Social History  Substance Use Topics  . Smoking status: Never Smoker  . Smokeless tobacco: Never Used  . Alcohol use No     Allergies   Patient has no known allergies.  Review of Systems Review of Systems  Unable to perform ROS: Dementia   LEVEL 5 CAVEAT- Full history could not be obtained due to dementia.   Physical Exam Updated Vital Signs BP 94/60   Pulse 118   Temp 99 F (37.2 C) (Rectal)   Resp (!) 30   Ht 5\' 8"  (1.727 m)   SpO2 91%   Physical Exam  Constitutional: He is oriented to person, place, and time. He appears well-developed and well-nourished.  Non-toxic appearance.  Appears pale and tired. Non toxic appearing.   HENT:  Head: Normocephalic.  Eyes: EOM are normal.  Neck: Normal range of motion.  Cardiovascular: Tachycardia present.   Pulmonary/Chest: Effort normal.  Abdominal: Soft. He exhibits no distension. There is tenderness.  Mild epigastric tenderness.  Musculoskeletal: Normal range of motion.  Neurological: He is alert and oriented to person, place, and time.  Psychiatric: He has a normal mood and affect.  Nursing note and vitals reviewed.    ED Treatments / Results  DIAGNOSTIC STUDIES: Oxygen Saturation is 91% on 2 L/min [place], low by my interpretation.   COORDINATION OF CARE: 10:16 AM- Will order Protonix drip and IV fluids. Will admit patient. Sister and patient verbalizes understanding and agrees to  plan.  1:00 PM- Reexamined pt due to vomiting while being rolled over on his side. Pt complaining of abdominal pain.  CRITICAL CARE Performed by: Raeford Razor, MD Total critical care time: 40 minutes Critical care time was exclusive of separately billable procedures and treating other patients. Critical care was necessary to treat or prevent imminent or life-threatening deterioration. Critical care was time spent personally by me on the following activities: development of treatment plan with patient and/or surrogate as well as nursing, discussions with consultants, evaluation of patient's response to treatment, examination of patient, obtaining history from patient or surrogate, ordering and performing treatments and interventions, ordering and review of laboratory studies, ordering and review of radiographic studies, pulse oximetry and re-evaluation of patient's condition.  Cardiopulmonary Resuscitation (CPR) Procedure Note Directed/Performed by: Raeford Razor, MD I personally directed ancillary staff and/or performed CPR in an effort to regain return of spontaneous circulation and to maintain cardiac, neuro and systemic perfusion.    INTUBATION Performed by: Raeford Razor, MD  Required items: required blood products, implants, devices, and special equipment available Patient identity confirmed: provided demographic data and hospital-assigned identification number Time out: Immediately prior to procedure a "time out" was called to verify the correct patient, procedure, equipment, support staff and site/side marked as required.  Indications: respiratory arrest  Intubation method: Glidescope Laryngoscopy   Preoxygenation: BVM  Sedatives: n/a Paralytic: rocuronium  Tube Size: 7.5 cuffed  Post-procedure assessment: chest rise and ETCO2 monitor Breath sounds: equal and absent over the epigastrium Tube secured with: ETT holder  Chest x-ray findings: CPR was discontinued shortly  after intubation. CXR not completed in the few minutes between intubation and stopping CPR.   Patient tolerated the procedure well with no immediate complications.   Medications  sodium chloride 0.9 % bolus 2,000 mL (2,000 mLs Intravenous New Bag/Given Mar 07, 2016 1100)  pantoprazole (PROTONIX) 80 mg in sodium chloride 0.9 % 100 mL IVPB (0 mg Intravenous Stopped 2016/03/07 1120)    Labs (all labs ordered are listed, but only abnormal results are displayed) Labs Reviewed  LIPASE, BLOOD - Abnormal; Notable for the following:       Result Value   Lipase 52 (*)    All other components within normal limits  COMPREHENSIVE METABOLIC PANEL -  Abnormal; Notable for the following:    Potassium 5.3 (*)    CO2 19 (*)    Glucose, Bld 145 (*)    BUN 34 (*)    Creatinine, Ser 2.44 (*)    Calcium 8.0 (*)    Total Protein 6.0 (*)    Albumin 2.3 (*)    AST 1,619 (*)    ALT 1,617 (*)    GFR calc non Af Amer 22 (*)    GFR calc Af Amer 25 (*)    All other components within normal limits  CBC - Abnormal; Notable for the following:    WBC 13.6 (*)    RBC 4.01 (*)    Hemoglobin 12.2 (*)    HCT 35.6 (*)    All other components within normal limits  POC OCCULT BLOOD, ED - Abnormal; Notable for the following:    Fecal Occult Bld POSITIVE (*)    All other components within normal limits  I-STAT CG4 LACTIC ACID, ED - Abnormal; Notable for the following:    Lactic Acid, Venous 4.41 (*)    All other components within normal limits  CBG MONITORING, ED - Abnormal; Notable for the following:    Glucose-Capillary 106 (*)    All other components within normal limits  CULTURE, BLOOD (ROUTINE X 2)  CULTURE, BLOOD (ROUTINE X 2)  HEPATITIS PANEL, ACUTE  TYPE AND SCREEN  ABO/RH    EKG  EKG Interpretation  Date/Time:  Wednesday 02-29-2016 13:35:42 EST Ventricular Rate:  107 PR Interval:    QRS Duration: 147 QT Interval:  362 QTC Calculation: 483 R Axis:   54 Text Interpretation:  Complete AV block  with wide QRS complex IVCD, consider atypical LBBB Baseline wander in lead(s) V1 new changes since the EKG at 11:00 Confirmed by [facility] MD, JULIE ([id number]) on 02/17/2016 3:43:18 PM       Radiology US Abdomen Limited Ruq  Result Date: February 29, 2016 CLINICAL DATA:  Hypotension, increased LFTs EXAM: US ABDOMEN LIMITED - RIGHT UPPER QUADRANT COMPARISON:  None. FINDINGS: Gallbladder: No definite shadowing gallstones are noted within gallbladder. No sonographic Murphy's sign. No thickening of gallbladder wall. Echogenic foci are noted in gallbladder neck region the largest measures 4 mm. This may represent cholesterol or gallbladder wall polyps. Common bile duct: Diameter: Not visualized Liver: There is heterogeneous liver echogenicity. Hyperechoic lesion within right hepatic lobe centrally measures 1.9 x 1.4 cm. This may represent atypical hemangioma. Further correlation with enhanced CT or MRI is recommended for confirmation. The exam is suboptimal due to patient's condition. IMPRESSION: Suboptimal exam due to patient's condition hypotension and vomiting. No definite shadowing gallstones are noted within gallbladder. Echogenic foci within gallbladder neck may represent cholesterol polyps or gallbladder wall polyps the largest measures 4 mm. The CBD is not visualized. Heterogeneous liver echogenicity. Hyperechoic lesion centrally within liver measures 1.9 cm. This may represent atypical hemangioma. Further evaluation with enhanced CT or MRI is recommended. Electronically Signed   By: Natasha Mead M.D.   On: 02/29/2016 13:16    Procedures Procedures (including critical care time)  Medications Ordered in ED Medications  sodium chloride 0.9 % bolus 2,000 mL (2,000 mLs Intravenous New Bag/Given 02-29-16 1100)  pantoprazole (PROTONIX) 80 mg in sodium chloride 0.9 % 100 mL IVPB (0 mg Intravenous Stopped 29-Feb-2016 1120)     Initial Impression / Assessment and Plan / ED Course  I have reviewed the triage vital signs  and the nursing notes.  Pertinent labs & imaging results that  were available during my care of the patient were reviewed by me and considered in my medical decision making (see chart for details).  Clinical Course     89yM with hypotension and n/v. Unclear as to exact etiology. When he returned from ultrasound he looked sick. He was diaphoretic and had audible rhonchi. He kept asking to sit up and appeared uncomfortable. I'm not sure is he aspirated. Shortly later he became apneic and lost pulses. CPR was started and he was intubated. He received several rounds of meds. His sister remained at the bedside the entire time. He did brieflyy regain pulse for less than a minute. Repeat 12 lead EKG looked like complete HB. He then lost pulses again. After several more minutes of CPR, his sister requested we stop. He was in PEA at that time. Time of death 39. Medical examiner was notified.   Final Clinical Impressions(s) / ED Diagnoses   Final diagnoses:  Pain  AKI (acute kidney injury) (HCC)  Hypotension, unspecified hypotension type  Abnormal LFTs    New Prescriptions New Prescriptions   No medications on file     Raeford Razor, MD 02/18/16 1434

## 2016-03-09 NOTE — Code Documentation (Addendum)
Pulse Check perforemd at 1326. PEA on monitor. Compressions resumed immediately

## 2016-03-09 NOTE — Code Documentation (Signed)
Per Dr. Juleen ChinaKohut, family requests care to cease at this time. Dr. Juleen ChinaKohut elected to remove ETT at this time.

## 2016-03-09 NOTE — Code Documentation (Signed)
Family updated as to patient's status. Pt's sister is outside of room with Charge RN Lawson FiscalLori who is continuously updating her.

## 2016-03-09 NOTE — Code Documentation (Signed)
Family at beside. Family given emotional support. Chaplain paged.

## 2016-03-09 NOTE — ED Notes (Signed)
[facility], Olegario MessierKathy RN contacted to notify of patient passing per Dr. Juleen ChinaKohut.

## 2016-03-09 NOTE — ED Notes (Signed)
Pt sister at bedside, requesting no invasive catheters unless absolutely necessary. Aware of need for urine sample. Pt and sister approve of placement of condom catheter at this time.

## 2016-03-09 NOTE — Code Documentation (Addendum)
Pulse check with doppler performed and no pulse. PEA on monitor. Compressions resumed immediately. Intubation successful with color change - 8 at 25cm.

## 2016-03-09 NOTE — ED Notes (Signed)
Placed patient on there monitor and into a gown

## 2016-03-09 NOTE — Code Documentation (Addendum)
Pads applied by Manuela Schwartzuth RN. Compression initiated. PEA on monitor. No pulses palpable.

## 2016-03-09 NOTE — ED Triage Notes (Signed)
[facility] for weakness and diarrhea that started this morning.

## 2016-03-09 DEATH — deceased

## 2017-11-11 IMAGING — US US ABDOMEN LIMITED
1 series · 13 of 25 positions shown · non-contrast
Comparison: None.

CLINICAL DATA: Hypotension, increased LFTs

EXAM:
US ABDOMEN LIMITED - RIGHT UPPER QUADRANT

[Series 1: us abdomen limited · 0.25mm/px · 13 of 44 slices shown]
[im 1/44]
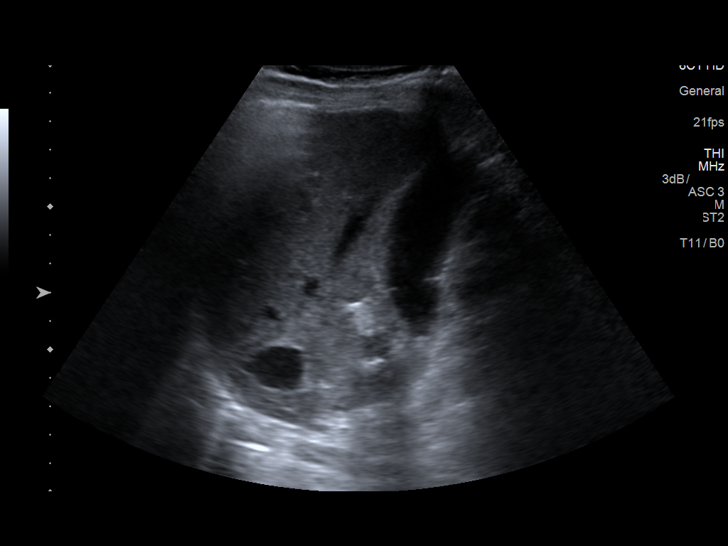
[im 4/44]
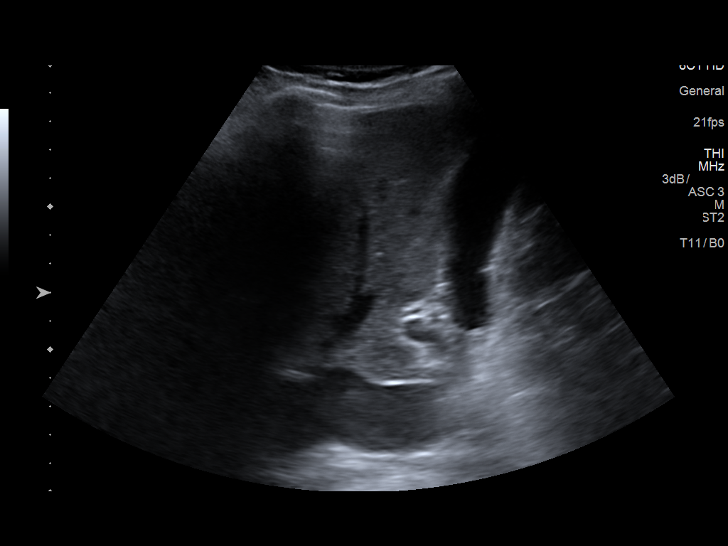
[im 8/44]
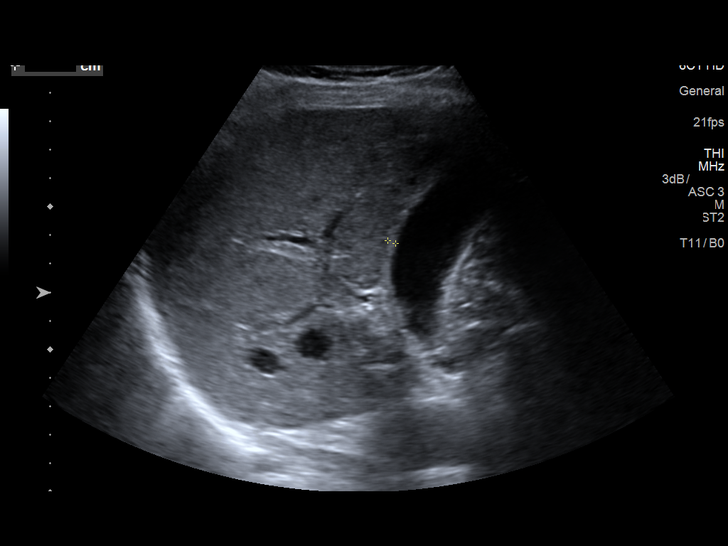
[im 11/44]
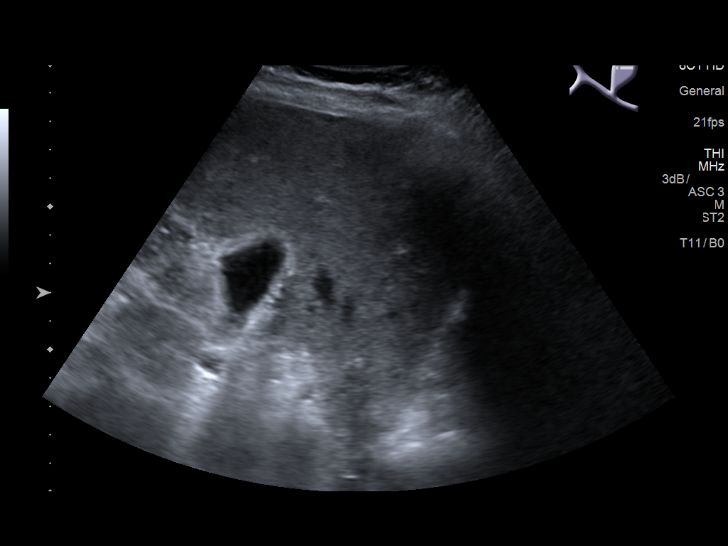
[im 15/44]
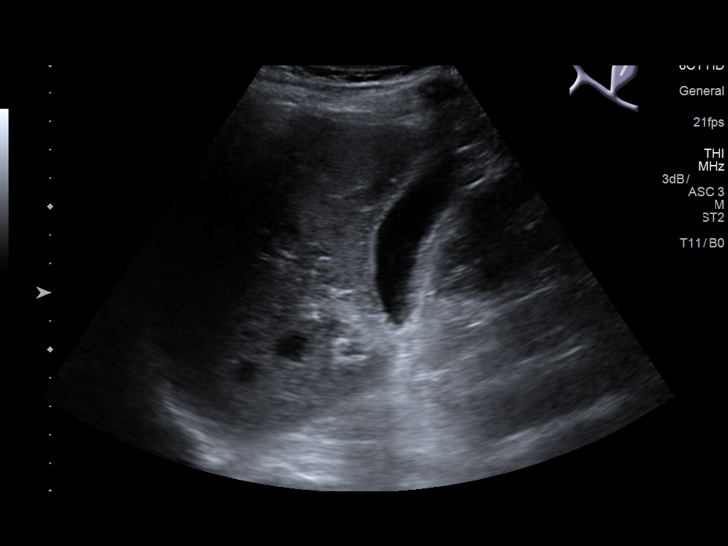
[im 18/44]
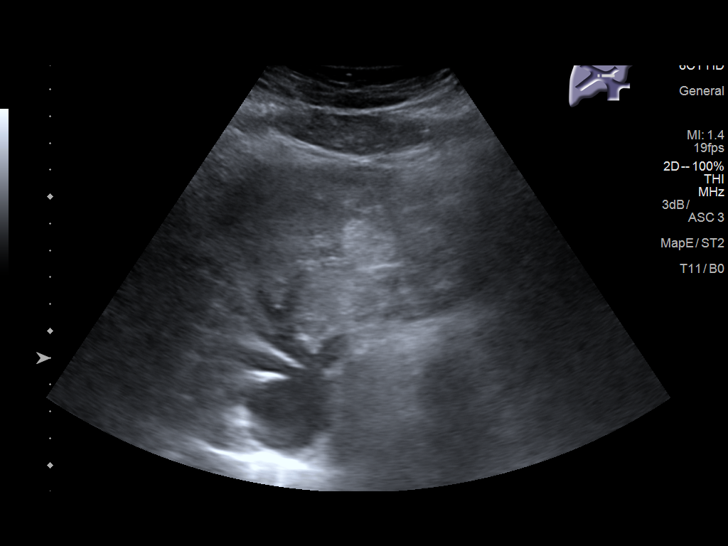
[im 22/44]
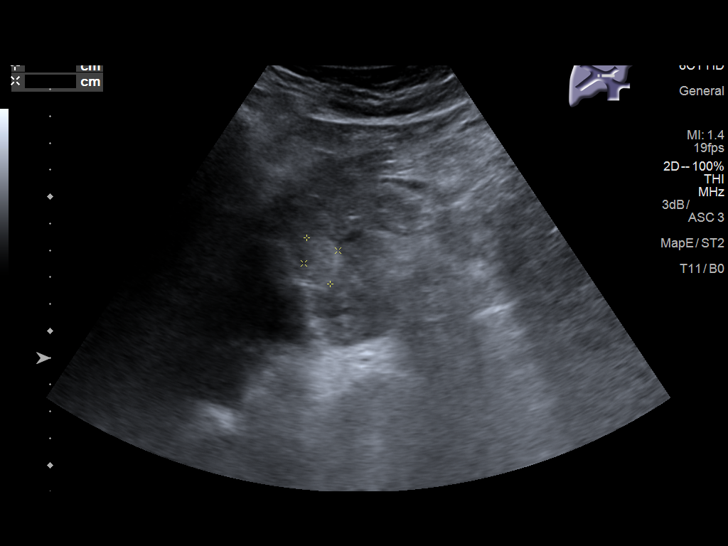
[im 26/44]
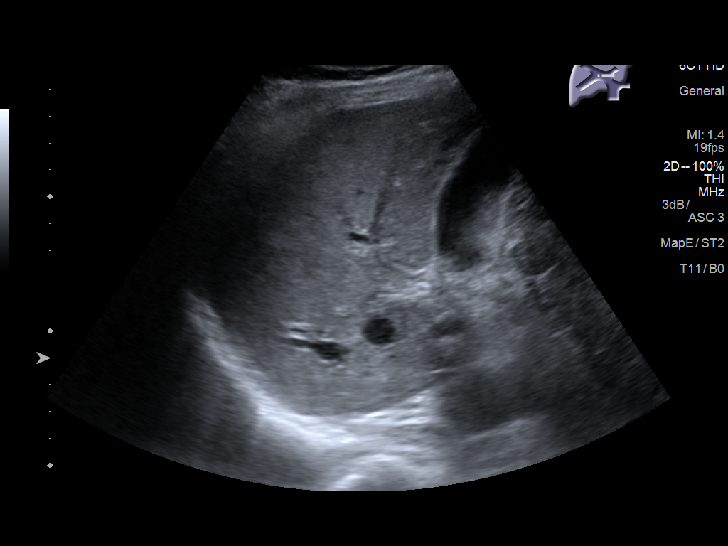
[im 29/44]
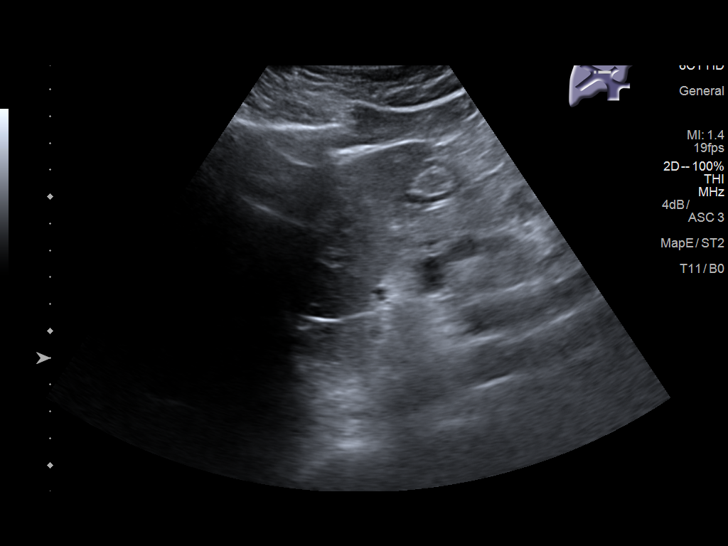
[im 33/44]
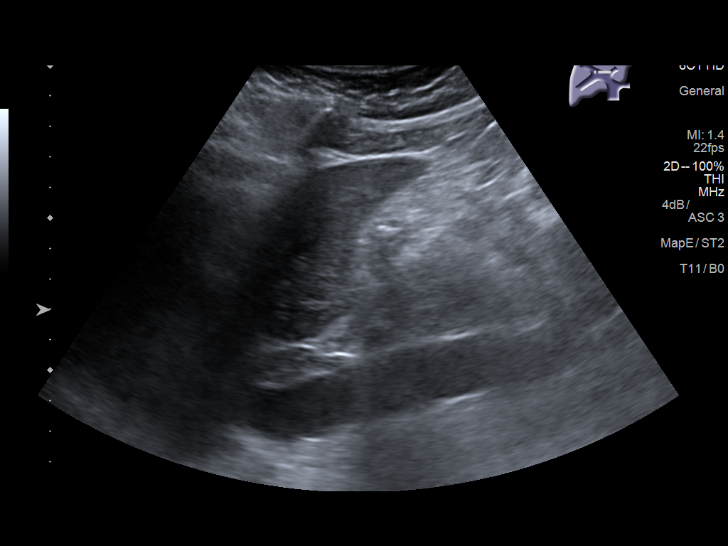
[im 36/44]
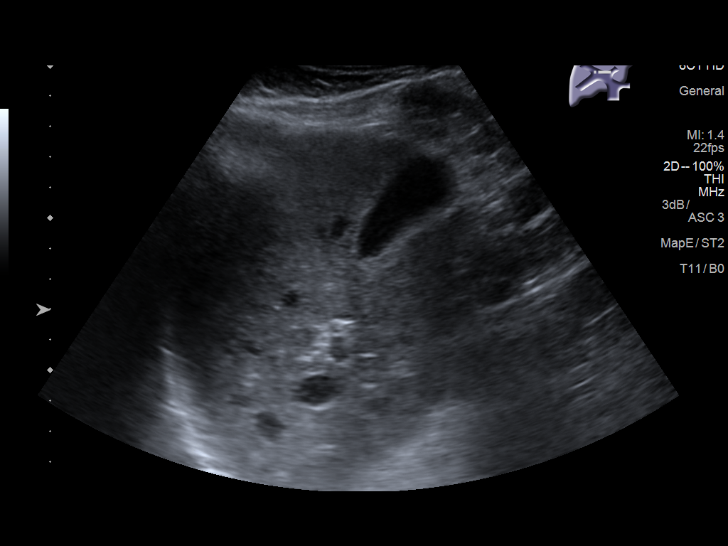
[im 40/44]
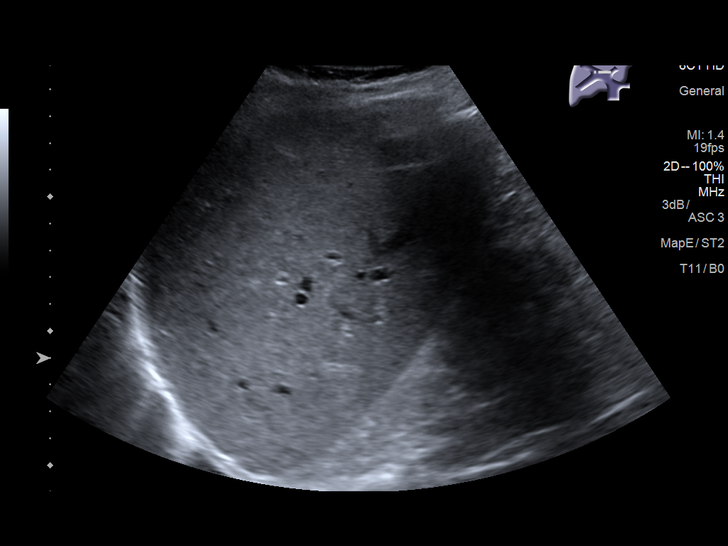
[im 44/44]
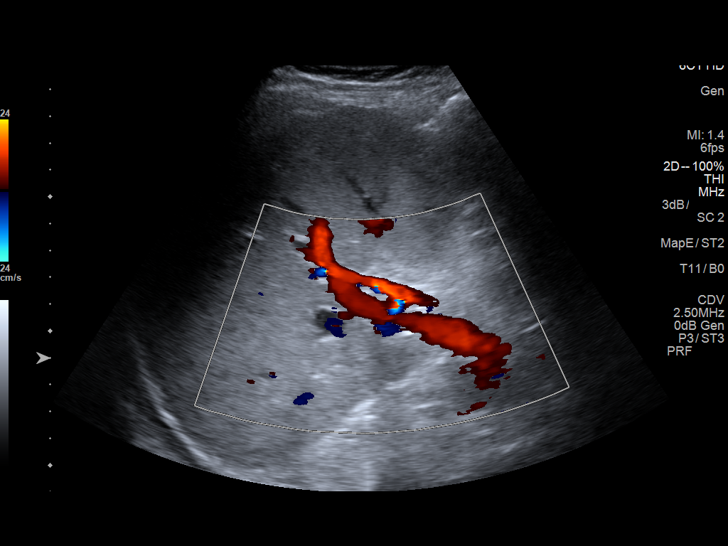

[13 of 25 positions shown; findings below may reference images not displayed]

FINDINGS: Gallbladder:

No definite shadowing gallstones are noted within gallbladder. No
sonographic Murphy's sign. No thickening of gallbladder wall.
Echogenic foci are noted in gallbladder neck region the largest
measures 4 mm. This may represent cholesterol or gallbladder wall
polyps.

Common bile duct:

Diameter: Not visualized

Liver:

There is heterogeneous liver echogenicity. Hyperechoic lesion within
right hepatic lobe centrally measures 1.9 x 1.4 cm. This may
represent atypical hemangioma. Further correlation with enhanced CT
or MRI is recommended for confirmation. The exam is suboptimal due
to patient's condition.
IMPRESSION: Suboptimal exam due to patient's condition hypotension and vomiting.
No definite shadowing gallstones are noted within gallbladder.
Echogenic foci within gallbladder neck may represent cholesterol
polyps or gallbladder wall polyps the largest measures 4 mm. The CBD
is not visualized. Heterogeneous liver echogenicity. Hyperechoic
lesion centrally within liver measures 1.9 cm. This may represent
atypical hemangioma. Further evaluation with enhanced CT or MRI is
recommended.
